# Patient Record
Sex: Male | Born: 1975 | Race: White | Hispanic: No | Marital: Married | Smoking: Current every day smoker
Health system: Southern US, Community
[De-identification: ages and names within clinical notes are randomized; demographics above are authoritative.]

## PROBLEM LIST (undated history)

## (undated) HISTORY — PX: HERNIA REPAIR: SHX51

---

## 2005-03-07 ENCOUNTER — Emergency Department (HOSPITAL_COMMUNITY): Admission: EM | Admit: 2005-03-07 | Discharge: 2005-03-07 | Payer: Self-pay | Admitting: Emergency Medicine

## 2005-03-10 ENCOUNTER — Emergency Department (HOSPITAL_COMMUNITY): Admission: EM | Admit: 2005-03-10 | Discharge: 2005-03-10 | Payer: Self-pay | Admitting: Emergency Medicine

## 2005-03-11 ENCOUNTER — Inpatient Hospital Stay (HOSPITAL_COMMUNITY): Admission: EM | Admit: 2005-03-11 | Discharge: 2005-03-14 | Payer: Self-pay | Admitting: Emergency Medicine

## 2006-07-26 ENCOUNTER — Emergency Department (HOSPITAL_COMMUNITY): Admission: EM | Admit: 2006-07-26 | Discharge: 2006-07-26 | Payer: Self-pay | Admitting: Emergency Medicine

## 2011-10-10 ENCOUNTER — Encounter: Payer: Self-pay | Admitting: *Deleted

## 2011-10-10 ENCOUNTER — Emergency Department (INDEPENDENT_AMBULATORY_CARE_PROVIDER_SITE_OTHER): Payer: BC Managed Care – PPO

## 2011-10-10 ENCOUNTER — Emergency Department (HOSPITAL_BASED_OUTPATIENT_CLINIC_OR_DEPARTMENT_OTHER)
Admission: EM | Admit: 2011-10-10 | Discharge: 2011-10-10 | Disposition: A | Discharge status: Home / Self Care | Payer: BC Managed Care – PPO | Attending: Emergency Medicine | Admitting: Emergency Medicine

## 2011-10-10 DIAGNOSIS — M545 Low back pain, unspecified: Secondary | ICD-10-CM

## 2011-10-10 DIAGNOSIS — Y92009 Unspecified place in unspecified non-institutional (private) residence as the place of occurrence of the external cause: Secondary | ICD-10-CM | POA: Insufficient documentation

## 2011-10-10 DIAGNOSIS — W108XXA Fall (on) (from) other stairs and steps, initial encounter: Secondary | ICD-10-CM | POA: Insufficient documentation

## 2011-10-10 DIAGNOSIS — S239XXA Sprain of unspecified parts of thorax, initial encounter: Secondary | ICD-10-CM | POA: Insufficient documentation

## 2011-10-10 DIAGNOSIS — IMO0002 Reserved for concepts with insufficient information to code with codable children: Secondary | ICD-10-CM

## 2011-10-10 DIAGNOSIS — W19XXXA Unspecified fall, initial encounter: Secondary | ICD-10-CM

## 2011-10-10 DIAGNOSIS — F17200 Nicotine dependence, unspecified, uncomplicated: Secondary | ICD-10-CM | POA: Insufficient documentation

## 2011-10-10 MED ORDER — OXYCODONE-ACETAMINOPHEN 5-325 MG PO TABS: 2.0000 | ORAL_TABLET | ORAL | Status: AC | PRN

## 2011-10-10 MED ORDER — ONDANSETRON HCL 4 MG/2ML IJ SOLN
INTRAMUSCULAR | Status: AC
  Filled 2011-10-10: qty 2

## 2011-10-10 MED ORDER — HYDROMORPHONE HCL PF 1 MG/ML IJ SOLN
1.0000 mg | Freq: Once | INTRAMUSCULAR | Status: AC
  Administered 2011-10-10: 1 mg via INTRAVENOUS
  Filled 2011-10-10: qty 1

## 2011-10-10 MED ORDER — KETOROLAC TROMETHAMINE 30 MG/ML IJ SOLN
30.0000 mg | Freq: Once | INTRAMUSCULAR | Status: AC
  Administered 2011-10-10: 30 mg via INTRAVENOUS
  Filled 2011-10-10: qty 1

## 2011-10-10 MED ORDER — ONDANSETRON HCL 4 MG/2ML IJ SOLN
4.0000 mg | Freq: Once | INTRAMUSCULAR | Status: AC
  Administered 2011-10-10: 4 mg via INTRAVENOUS

## 2011-10-10 MED ORDER — SODIUM CHLORIDE 0.9 % IV SOLN
Freq: Once | INTRAVENOUS | Status: AC
  Administered 2011-10-10: 16:00:00 via INTRAVENOUS

## 2011-10-10 MED ORDER — DIAZEPAM 5 MG PO TABS: 5.0000 mg | ORAL_TABLET | Freq: Two times a day (BID) | ORAL | Status: AC

## 2011-10-10 NOTE — ED Notes (Signed)
Care plan and follow up reviewed 

## 2011-10-10 NOTE — ED Notes (Signed)
Patient transported to X-ray 

## 2011-10-10 NOTE — ED Provider Notes (Signed)
History    This chart was scribed for Philip Baker, MD, MD by Smitty Pluck. The patient was seen in room MHT14 and the patient's care was started at 4:59PM.  CSN: [id number] Arrival date & time: 10/10/2011  3:14 PM   First MD Initiated Contact with Patient 10/10/11 1654      Chief Complaint  Patient presents with  . Fall    (Consider location/radiation/quality/duration/timing/severity/associated sxs/prior treatment) Patient is a 35 y.o. male presenting with fall. The history is provided by the patient.  Fall   Philip Bond is a 35 y.o. male who presents to the Emergency Department complaining of moderate pain in left groin and lower back area onset today after fall down stairs today while getting items out of the attic. Pt still is able to move. He denies head injury or LOC. Pt reports that he has had back problems before.    History reviewed. No pertinent past medical history.  Past Surgical History  Procedure Date  . Hernia repair     History reviewed. No pertinent family history.  History  Substance Use Topics  . Smoking status: Current Everyday Smoker  . Smokeless tobacco: Not on file  . Alcohol Use: No      Review of Systems  All other systems reviewed and are negative.  10 Systems reviewed and are negative for acute change except as noted in the HPI.   Allergies  Review of patient's allergies indicates no known allergies.  Home Medications   Current Outpatient Rx  Name Route Sig Dispense Refill  . HYDROCODONE-ACETAMINOPHEN 7.5-325 MG PO TABS Oral Take 1 tablet by mouth every 6 (six) hours as needed. For pain        BP 139/88  Pulse 58  Temp(Src) 97.7 F (36.5 C) (Oral)  Resp 18  Ht 6\' 2"  (1.88 m)  Wt 185 lb (83.915 kg)  BMI 23.75 kg/m2  SpO2 100%  Physical Exam  Nursing note and vitals reviewed. Musculoskeletal:       5/5 strength in extremities  spinal tenderness  Mid and Lower Lumbar spinal tenderness    +2 grip strength  Left  hip pain with internal/external rotation        ED Course  Procedures (including critical care time)  DIAGNOSTIC STUDIES: Oxygen Saturation is 100% on room air, normal by my interpretation.    COORDINATION OF CARE:    Labs Reviewed - No data to display Dg Thoracic Spine 4v  10/10/2011  *RADIOLOGY REPORT*  Clinical Data: Fall, low back pain  THORACIC SPINE - 4+ VIEW  Comparison: None.  Findings: Thoracic spine is normal in alignment and position.  No fracture or dislocation is seen.  Vertebral body heights and intervertebral disc spaces are maintained.  Visualized lungs are grossly clear.  IMPRESSION: Normal thoracic spine radiographs.  Original Report Authenticated By: Charline Bills, M.D.   Dg Lumbar Spine Complete  10/10/2011  *RADIOLOGY REPORT*  Clinical Data: Fall, low back pain  LUMBAR SPINE - COMPLETE 4+ VIEW  Comparison: None.  Findings: Lumbar spine is normal alignment and position.  No evidence of fracture or dislocation.  Vertebral body heights are maintained.  Mild degenerative changes at L4-5.  Prior left ventral hernia repair.  IMPRESSION: No fracture or dislocation is seen.  Original Report Authenticated By: Charline Bills, M.D.     No diagnosis found.    MDM  Pt given pain meds and feels better--repeat neuro exam remains stable      I personally performed the  services described in this documentation, which was scribed in my presence. The recorded information has been reviewed and considered.    Philip Baker, MD 10/10/11 [phone]

## 2011-10-10 NOTE — ED Notes (Signed)
Pt states he fell out of the attic earlier today. Now c/o pain to lower back and pelvis. Landed on buttocks. Tingling to feet. Feel weak.

## 2011-10-10 NOTE — ED Notes (Signed)
Pt getting items out of attic.  Pt feel approx 5 feet onto buttocks. Pt c/o pain in lower sacral area.  Pt denies loss of bowel or bladder.  Pt denies head injury or loc.  Pt states his legs feel weak and are trembling from pain.

## 2012-06-27 IMAGING — CR DG LUMBAR SPINE COMPLETE 4+V
5 series · 5 of 5 positions shown · non-contrast
Comparison: None.

CLINICAL DATA: Fall, low back pain

LUMBAR SPINE - COMPLETE 4+ VIEW

[t l-spine a.p.]
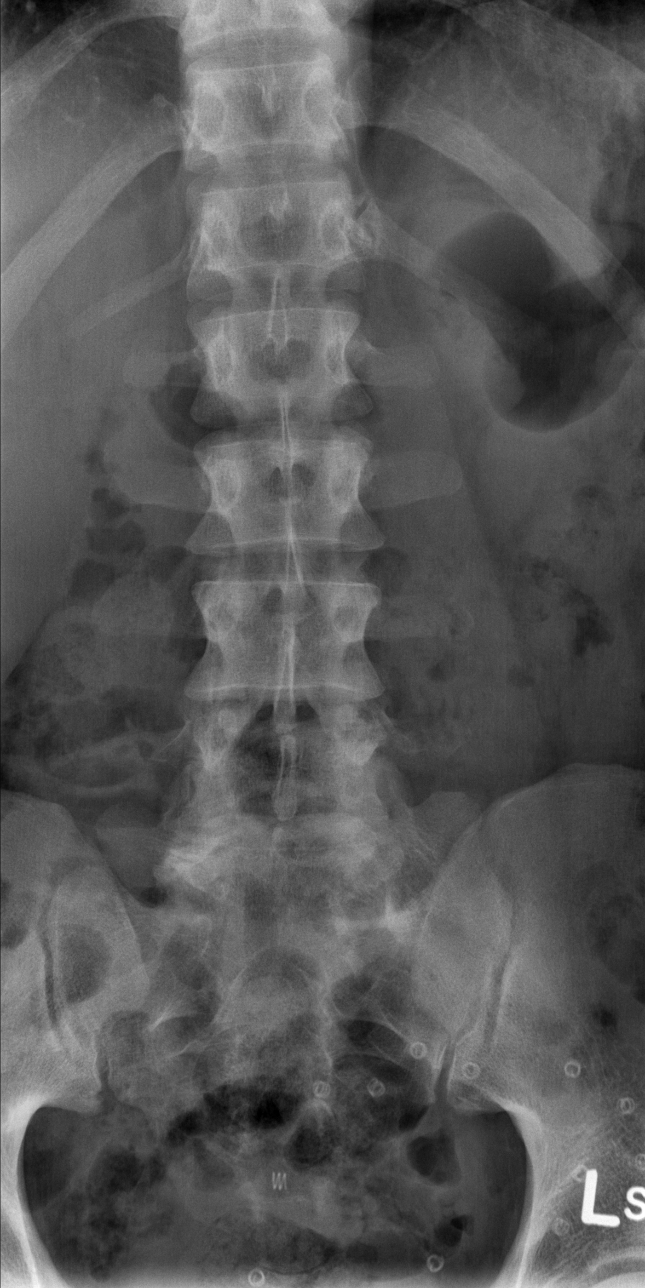

[t l-spine oblique exposure (1 of 2)]
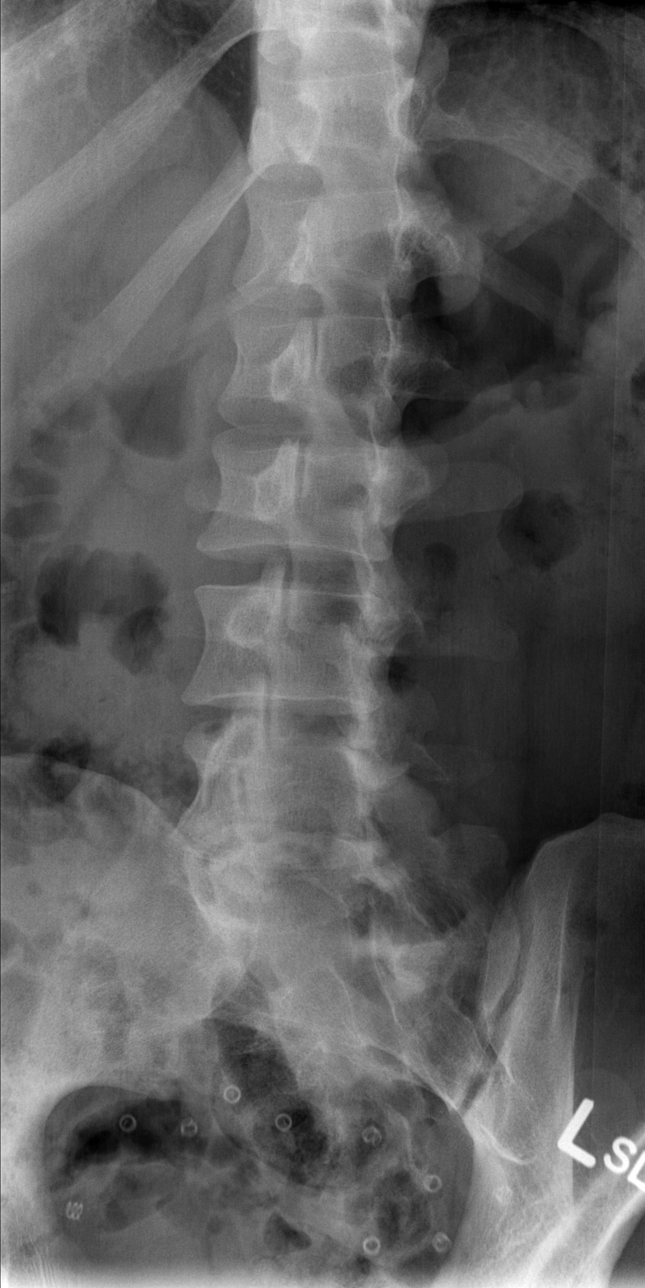

[t l-spine oblique exposure (2 of 2)]
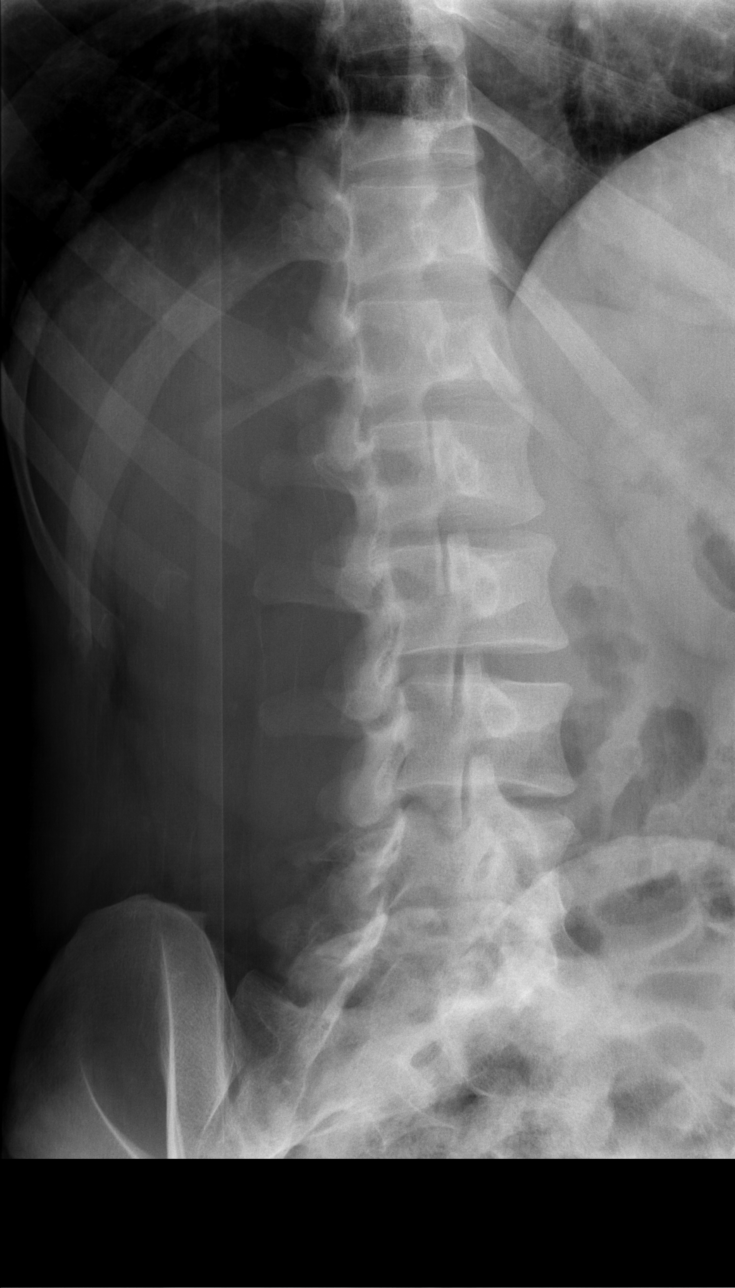

[t l-spine lat]
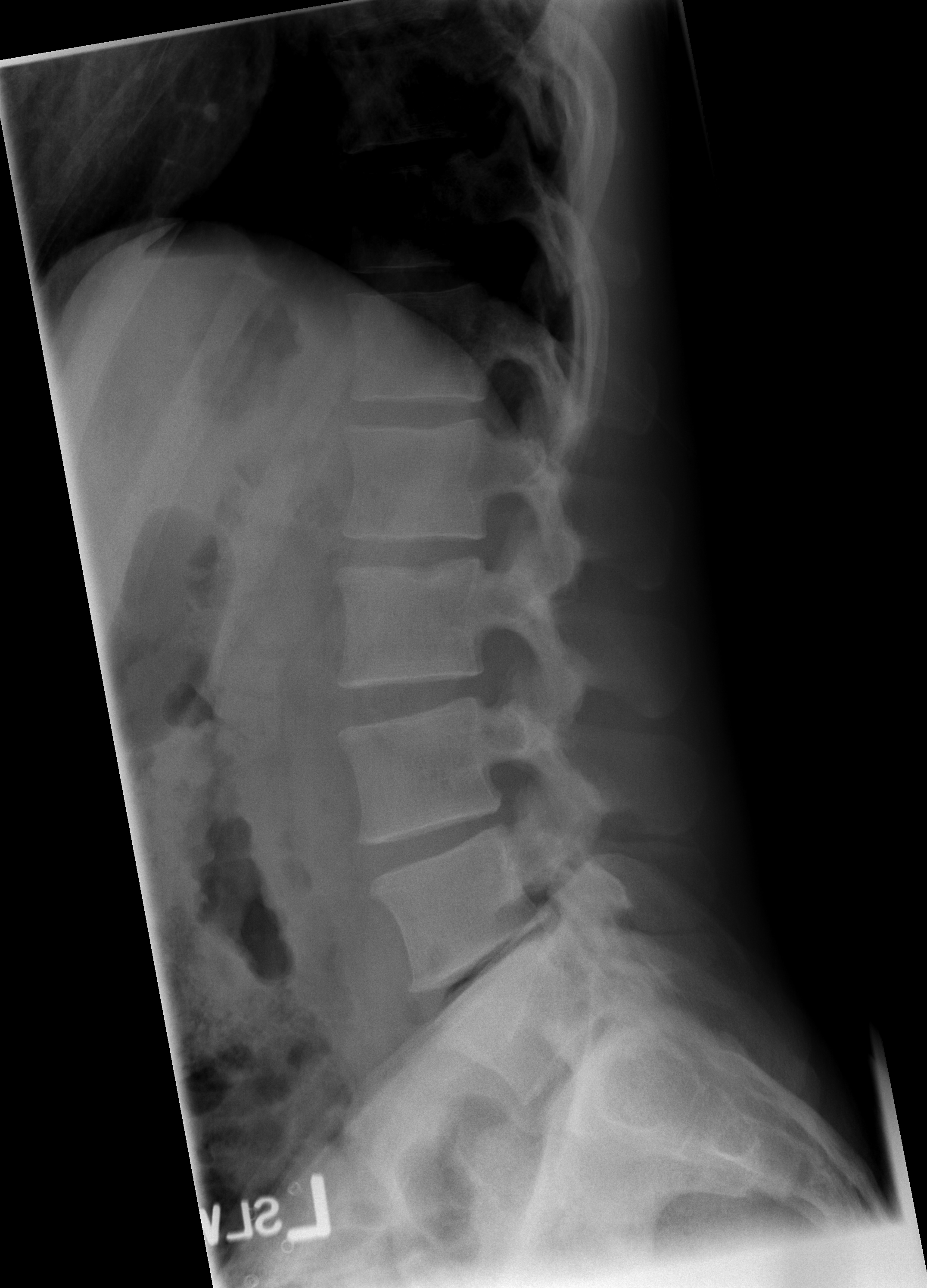

[t l-spine l5-s1 spot]
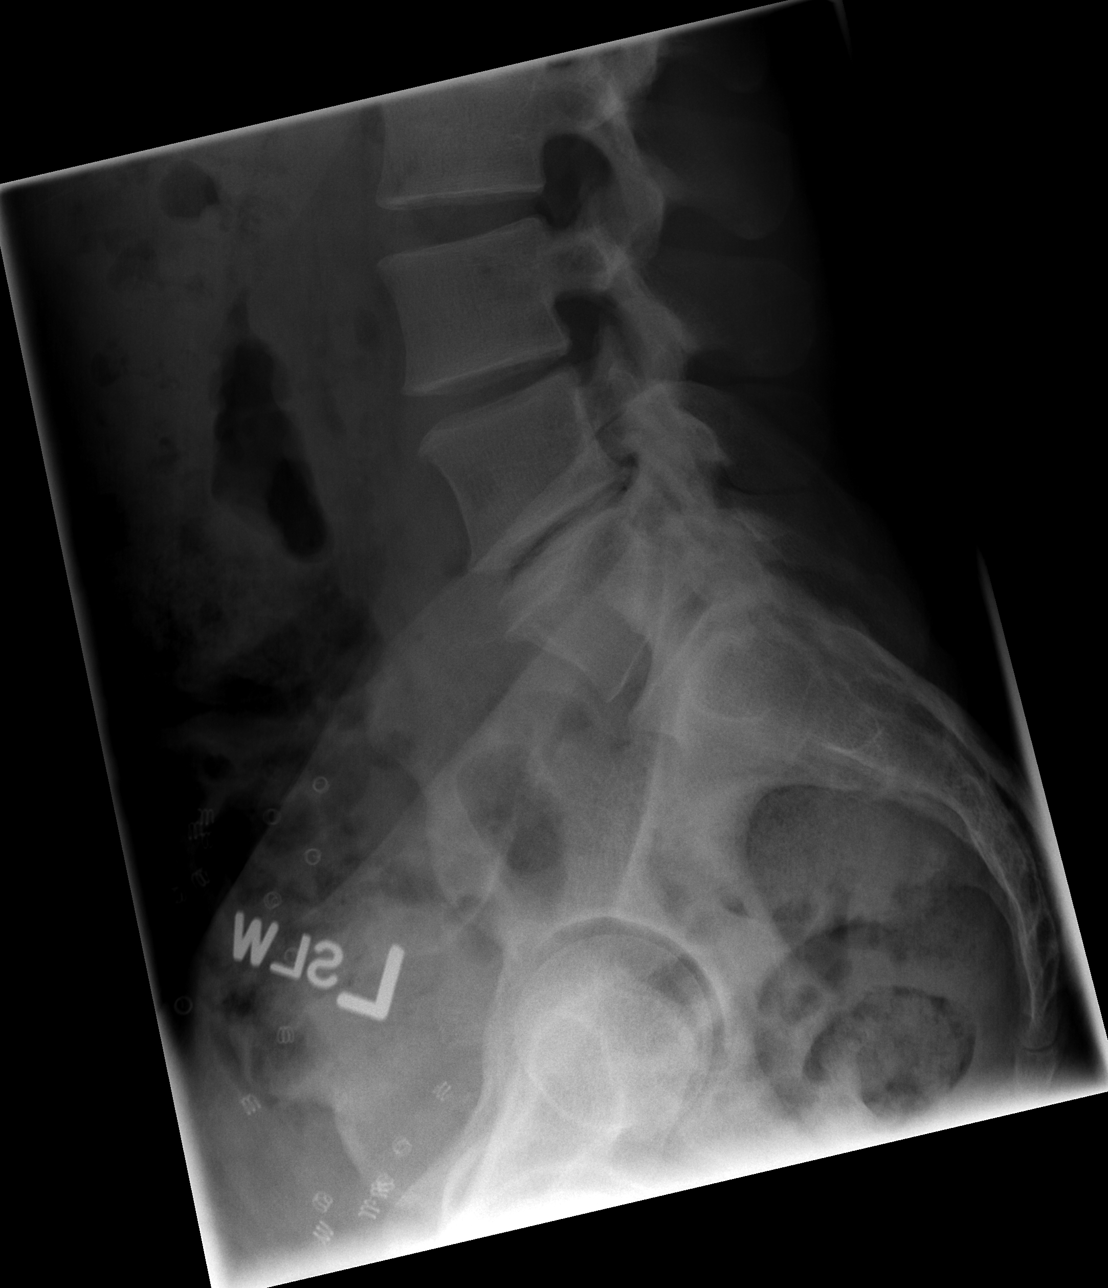

[5 of 5 positions shown; findings below may reference images not displayed]

FINDINGS: Lumbar spine is normal alignment and position.

No evidence of fracture or dislocation.  Vertebral body heights are
maintained.

Mild degenerative changes at L4-5.

Prior left ventral hernia repair.
IMPRESSION: No fracture or dislocation is seen.

## 2020-03-07 ENCOUNTER — Encounter: Payer: Self-pay | Admitting: Sports Medicine

## 2020-03-07 ENCOUNTER — Other Ambulatory Visit: Payer: Self-pay | Admitting: Sports Medicine

## 2020-03-07 ENCOUNTER — Ambulatory Visit: Payer: BC Managed Care – PPO | Admitting: Sports Medicine

## 2020-03-07 ENCOUNTER — Ambulatory Visit (INDEPENDENT_AMBULATORY_CARE_PROVIDER_SITE_OTHER): Payer: BC Managed Care – PPO

## 2020-03-07 ENCOUNTER — Other Ambulatory Visit: Payer: Self-pay

## 2020-03-07 DIAGNOSIS — B351 Tinea unguium: Secondary | ICD-10-CM | POA: Diagnosis not present

## 2020-03-07 DIAGNOSIS — M76821 Posterior tibial tendinitis, right leg: Secondary | ICD-10-CM | POA: Diagnosis not present

## 2020-03-07 DIAGNOSIS — M2141 Flat foot [pes planus] (acquired), right foot: Secondary | ICD-10-CM

## 2020-03-07 DIAGNOSIS — M2142 Flat foot [pes planus] (acquired), left foot: Secondary | ICD-10-CM

## 2020-03-07 DIAGNOSIS — M79671 Pain in right foot: Secondary | ICD-10-CM

## 2020-03-07 DIAGNOSIS — Q742 Other congenital malformations of lower limb(s), including pelvic girdle: Secondary | ICD-10-CM

## 2020-03-07 DIAGNOSIS — M898X7 Other specified disorders of bone, ankle and foot: Secondary | ICD-10-CM | POA: Diagnosis not present

## 2020-03-07 NOTE — Progress Notes (Signed)
Subjective: Philip Bond is a 44 y.o. male patient seen today in office with complaint of 1.  Mildly painful thickened and discolored nails. Patient is desiring treatment for nail changes; has tried trimming them in the past with no improvement. Reports that nails are becoming difficult to manage because of the thickness and reports that they get really sore. 2.  Occasional pain at the medial foot and ankle reports that he had an injury in 2010 to this foot and ankle and ever since he is always had occasional pain with the bone at the side of the ankle joint sticking out a little bit more patient denies any rubbing or pain when in shoes but there are certain shoes that he cannot wear due to the pronounced bone.  Patient denies any redness warmth swelling or any ulceration over the area of the right foot at the prominent bone.  Patient also admits to a history of severe ankle sprains.  Patient has no other pedal complaints at this time.   Review of Systems  All other systems reviewed and are negative.   There are no problems to display for this patient.   Current Outpatient Medications on File Prior to Visit  Medication Sig Dispense Refill  . diclofenac (VOLTAREN) 75 MG EC tablet Take 75 mg by mouth 2 (two) times daily.     No current facility-administered medications on file prior to visit.    No Known Allergies  Objective: Physical Exam  General: Well developed, nourished, no acute distress, awake, alert and oriented x 3  Vascular: Dorsalis pedis artery 2/4 bilateral, Posterior tibial artery 2/4 bilateral, skin temperature warm to warm proximal to distal bilateral lower extremities, no varicosities, pedal hair present bilateral.  Neurological: Gross sensation present via light touch bilateral.   Dermatological: Skin is warm, dry, and supple bilateral, right third and fourth toenails are tender, elongated, thick, and discolored with mild subungal debris, no webspace macerations present  bilateral, no open lesions present bilateral, no callus/corns/hyperkeratotic tissue present bilateral. No signs of infection bilateral.  Musculoskeletal: Prominent navicular noted along the right foot, pes planus foot type, there is no pain to palpation along the posterior tibial tendon on the right, pes planus foot type, muscular strength within normal limits without painon range of motion. No pain with calf compression bilateral.  Assessment and Plan:  Problem List Items Addressed This Visit    None    Visit Diagnoses    Nail fungus    -  Primary   Relevant Orders   Culture, fungus without smear   Posterior tibial tendon dysfunction (PTTD) of right lower extremity       Accessory navicular bone of right foot       Exostosis of foot       Pes planus of both feet       Foot pain, right          -Examined patient -Discussed treatment options for painful dystrophic nails on the right -Fungal culture was obtained by removing a portion of the hard nail itself from each of the involved toenails using a sterile nail nipper and sent to [facility] lab. Patient tolerated the biopsy procedure well without discomfort or need for anesthesia.  -Discussed with patient treatment options for pes planus and enlarged navicular bone on the right -Advised patient since currently asymptomatic and only has occasional pain would recommend at this time good supportive shoes, insoles, and supportive bracing as needed especially when at work -Advised patient if right  foot and ankle pain becomes more persistent we can further work this up to further evaluate the extent of his navicular and even the posterior tibial tendon however at this time since symptoms are not acute and not constant we will closely monitor -Patient to return in 4 weeks for follow up evaluation and discussion of fungal culture results or sooner if symptoms worsen.  Advised patient at this visit we may consider proceeding with permanent nail avulsion  procedures of the third and fourth toenails pending the results of his fungal culture.  Patient was okay with this idea and reports that he is okay with having to lose his toenail if they cannot be salvaged due to the extent of the discoloration and thickness of them.  Asencion Islam, DPM

## 2020-04-04 ENCOUNTER — Ambulatory Visit: Payer: BC Managed Care – PPO | Admitting: Sports Medicine

## 2020-04-11 ENCOUNTER — Other Ambulatory Visit: Payer: Self-pay

## 2020-04-11 ENCOUNTER — Encounter: Payer: Self-pay | Admitting: Sports Medicine

## 2020-04-11 ENCOUNTER — Ambulatory Visit: Payer: BC Managed Care – PPO | Admitting: Sports Medicine

## 2020-04-11 DIAGNOSIS — M79674 Pain in right toe(s): Secondary | ICD-10-CM | POA: Diagnosis not present

## 2020-04-11 DIAGNOSIS — B351 Tinea unguium: Secondary | ICD-10-CM | POA: Diagnosis not present

## 2020-04-11 MED ORDER — NEOMYCIN-POLYMYXIN-HC 3.5-10000-1 OT SOLN: OTIC | 0 refills | Status: AC

## 2020-04-11 NOTE — Patient Instructions (Signed)

## 2020-04-11 NOTE — Progress Notes (Signed)
Subjective: Philip Bond is a 44 y.o. male patient presents to office today for review of fungal culture results and for possible nail procedure on the right. Patient reports that nothing as changed since last visit, no other pedal complaints.  There are no problems to display for this patient.   Current Outpatient Medications on File Prior to Visit  Medication Sig Dispense Refill  . diclofenac (VOLTAREN) 75 MG EC tablet Take 75 mg by mouth 2 (two) times daily.     No current facility-administered medications on file prior to visit.    No Known Allergies  Objective:  There were no vitals filed for this visit.  General: Well developed, nourished, in no acute distress, alert and oriented x3   Dermatology: Skin is warm, dry and supple bilateral. Right 3-4 toenail appears to be severely thickened and deformed. (+) faint focal proximal nail fold erythema. (-) Edema. (-) serosanguous drainage present. The remaining nails appear unremarkable at this time. There are no open sores, lesions or other signs of infection present.  Vascular: Dorsalis Pedis artery and Posterior Tibial artery pedal pulses are 2/4 bilateral with immedate capillary fill time. Pedal hair growth present. No lower extremity edema.   Neruologic: Grossly intact via light touch bilateral.  Musculoskeletal: Tenderness to palpation of the right 3,4 toenails  Fungal culture results: + Fungus and microtrauma   Assesement and Plan: Problem List Items Addressed This Visit    None    Visit Diagnoses    Nail fungus    -  Primary   Toe pain, right         -Discussed treatment alternatives and plan of care; Explained permanent/temporary nail avulsion and post procedure course to patient. Patient elects for temp nail avulsion on right 3,4 toenails - After a verbal and written consent, injected 3 ml of a 50:50 mixture of 2% plain lidocaine and 0.5% plain marcaine in a normal digital block fashion. Next, a betadine prep was  performed. Anesthesia was tested and found to be appropriate.  The offending right 3 and 4th toenail completely was then incised from the hyponychium to the epinychium. The offending nail border was removed and cleared from the field. The area was curretted for any remaining nail or spicules the area was then flushed with alcohol and dressed with antibiotic cream and a dry sterile dressing. -Patient was instructed to leave the dressing intact for today and begin soaking in a weak solution of betadine or Epsom salt and water tomorrow. Patient was instructed to soak for 15-20 minutes each day and apply neosporin/corticosporin and a gauze or bandaid dressing each day. -Patient was instructed to monitor the toe for signs of infection and return to office if toe becomes red, hot or swollen. -Advised ice, elevation, and tylenol or motrin if needed for pain.  -Patient is to return in 2 weeks for follow up care/nail check or sooner if problems arise. Advised patient that when he returns to office we sill start PO Lamisil.  Asencion Islam, DPM

## 2020-04-25 ENCOUNTER — Ambulatory Visit: Payer: BC Managed Care – PPO | Admitting: Sports Medicine

## 2020-04-25 ENCOUNTER — Encounter: Payer: Self-pay | Admitting: Sports Medicine

## 2020-04-25 ENCOUNTER — Other Ambulatory Visit: Payer: Self-pay

## 2020-04-25 DIAGNOSIS — B351 Tinea unguium: Secondary | ICD-10-CM

## 2020-04-25 DIAGNOSIS — M79674 Pain in right toe(s): Secondary | ICD-10-CM

## 2020-04-25 DIAGNOSIS — Z9889 Other specified postprocedural states: Secondary | ICD-10-CM

## 2020-04-25 MED ORDER — TERBINAFINE HCL 250 MG PO TABS: 250.0000 mg | ORAL_TABLET | Freq: Every day | ORAL | 0 refills | Status: AC

## 2020-04-25 NOTE — Progress Notes (Signed)
Subjective: Philip Bond is a 44 y.o. male patient returns to office today for follow up evaluation after having Right 3-4 toes temporary nail avulsion performed on (04-11-20). Patient has been soaking using epsom salt and applying topical antibiotic covered with bandaid daily but stopped a few days ago.  Patient reports that it was uncomfortable at first but now it feels normal with walking with slight redness no swelling no drainage or any other symptoms.  Reports that he still covering toes with daily Neosporin when he is at work. Patient deniesfever/chills/nausea/vomitting/any other related constitutional symptoms at this time.  There are no problems to display for this patient.   Current Outpatient Medications on File Prior to Visit  Medication Sig Dispense Refill  . diclofenac (VOLTAREN) 75 MG EC tablet Take 75 mg by mouth 2 (two) times daily.    Marland Kitchen neomycin-polymyxin-hydrocortisone (CORTISPORIN) OTIC solution Apply 2-3 drops to the ingrown toenail site twice daily. Cover with band-aid. 10 mL 0   No current facility-administered medications on file prior to visit.    No Known Allergies  Objective:  General: Well developed, nourished, in no acute distress, alert and oriented x3   Dermatology: Skin is warm, dry and supple bilateral.  Right third and fourth toes nail bed appears to be clean, dry, with mild granular tissue and surrounding eschar/scab. (-) Erythema. (-) Edema. (-) serosanguous drainage present. The remaining nails appear unremarkable at this time. There are no other lesions or other signs of infection  present.  Neurovascular status: Intact. No lower extremity swelling; No pain with calf compression bilateral.  Musculoskeletal: Decreased tenderness to palpation of the right third and fourth toes muscular strength within normal limits bilateral.   Assesement and Plan: Problem List Items Addressed This Visit    None    Visit Diagnoses    S/P nail surgery    -  Primary    Nail fungus       Toe pain, right          -Examined patient  -Right third and fourth toes well-healed with minimal dry scabbing -Patient may discontinue soaking -May leave open to air -Educated patient on long term care after nail surgery. -Patient was instructed to monitor the toe for reoccurrence and signs of infection; Patient advised to return to office or go to ER if toe becomes red, hot or swollen. -Ordered liver function panel for patient to start on Lamisil -Patient is to return in 6 weeks for medication check or sooner if problems arise.  Asencion Islam, DPM

## 2020-05-14 ENCOUNTER — Other Ambulatory Visit: Payer: Self-pay | Admitting: Sports Medicine

## 2020-05-14 DIAGNOSIS — M76821 Posterior tibial tendinitis, right leg: Secondary | ICD-10-CM

## 2020-06-06 ENCOUNTER — Encounter: Payer: Self-pay | Admitting: Sports Medicine

## 2020-06-06 ENCOUNTER — Other Ambulatory Visit: Payer: Self-pay

## 2020-06-06 ENCOUNTER — Ambulatory Visit (INDEPENDENT_AMBULATORY_CARE_PROVIDER_SITE_OTHER): Payer: BC Managed Care – PPO | Admitting: Sports Medicine

## 2020-06-06 DIAGNOSIS — M79674 Pain in right toe(s): Secondary | ICD-10-CM

## 2020-06-06 DIAGNOSIS — B351 Tinea unguium: Secondary | ICD-10-CM

## 2020-06-06 DIAGNOSIS — Z79899 Other long term (current) drug therapy: Secondary | ICD-10-CM

## 2020-06-06 DIAGNOSIS — Z9889 Other specified postprocedural states: Secondary | ICD-10-CM

## 2020-06-06 NOTE — Progress Notes (Signed)
Subjective: Philip Bond is a 44 y.o. male patient returns to office today for follow up evaluation after having Right 3-4 toes temporary nail avulsion performed on (04-11-20) and for medication check currently on Lamisil. Patient reports that he is doing good with no cough symptoms besides on time a bad taste from the medication reports that he has been consistent with taking the medication as instructed and has only missed about 3 days.  Denies nausea vomiting fever chills or any other constitutional symptoms at this time.  There are no problems to display for this patient.   Current Outpatient Medications on File Prior to Visit  Medication Sig Dispense Refill  . diclofenac (VOLTAREN) 75 MG EC tablet Take 75 mg by mouth 2 (two) times daily.    Marland Kitchen neomycin-polymyxin-hydrocortisone (CORTISPORIN) OTIC solution Apply 2-3 drops to the ingrown toenail site twice daily. Cover with band-aid. 10 mL 0  . terbinafine (LAMISIL) 250 MG tablet Take 1 tablet (250 mg total) by mouth daily. 90 tablet 0   No current facility-administered medications on file prior to visit.    No Known Allergies  Objective:  General: Well developed, nourished, in no acute distress, alert and oriented x3   Dermatology: Skin is warm, dry and supple bilateral.  Right third and fourth toes nail bed appears to be clean, with early nail ingrowth and dry scabbing. (-) Erythema. (-) Edema. (-) serosanguous drainage present. The remaining nails appear unremarkable at this time.  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  There are no other lesions or other signs of infection present.  Neurovascular status: Intact. No lower extremity swelling; No pain with calf compression bilateral.  Musculoskeletal: Decreased tenderness to palpation of the right third and fourth toes muscular strength within normal limits bilateral.   Assesement and Plan: Problem List Items Addressed This Visit     None    Visit Diagnoses    S/P nail surgery    -  Primary   Nail fungus       Toe pain, right       Encounter for long-term current use of high risk medication          -Examined patient  -Right third and fourth toes well-healed  -Continue with Lamisil until completed we will not do any additional blood work and since patient is tapering off medication -Patient is to return as needed if fails to continue to improve or sooner if problems arise.  Asencion Islam, DPM

## 2021-07-17 ENCOUNTER — Encounter: Payer: Self-pay | Admitting: Sports Medicine

## 2021-07-17 ENCOUNTER — Other Ambulatory Visit: Payer: Self-pay

## 2021-07-17 ENCOUNTER — Ambulatory Visit (INDEPENDENT_AMBULATORY_CARE_PROVIDER_SITE_OTHER): Payer: BC Managed Care – PPO | Admitting: Sports Medicine

## 2021-07-17 DIAGNOSIS — B351 Tinea unguium: Secondary | ICD-10-CM

## 2021-07-17 DIAGNOSIS — M79674 Pain in right toe(s): Secondary | ICD-10-CM

## 2021-07-17 DIAGNOSIS — L608 Other nail disorders: Secondary | ICD-10-CM | POA: Diagnosis not present

## 2021-07-17 DIAGNOSIS — M7989 Other specified soft tissue disorders: Secondary | ICD-10-CM | POA: Diagnosis not present

## 2021-07-17 NOTE — Patient Instructions (Signed)

## 2021-07-17 NOTE — Progress Notes (Signed)
Subjective: Philip Bond is a 45 y.o. male patient presents to office today for f/u of nail fungus at right 3-4 toes states that over time the nails have grown back about the same and getting some pain at the thick nail.  Patient wants to discuss other options for his big toenails now that they have reoccurred and are painful.  There are no problems to display for this patient.   Current Outpatient Medications on File Prior to Visit  Medication Sig Dispense Refill   diclofenac (VOLTAREN) 75 MG EC tablet Take 75 mg by mouth 2 (two) times daily.     neomycin-polymyxin-hydrocortisone (CORTISPORIN) OTIC solution Apply 2-3 drops to the ingrown toenail site twice daily. Cover with band-aid. 10 mL 0   terbinafine (LAMISIL) 250 MG tablet Take 1 tablet (250 mg total) by mouth daily. 90 tablet 0   No current facility-administered medications on file prior to visit.    No Known Allergies  Objective:  There were no vitals filed for this visit.  General: Well developed, nourished, in no acute distress, alert and oriented x3   Dermatology: Skin is warm, dry and supple bilateral. Right 3-4 toenail appears to be severely thickened and deformed at the distal aspects. (+) faint focal proximal nail fold erythema. (-) Edema. (-) serosanguous drainage present. The remaining nails appear unremarkable at this time. There are no open sores, lesions or other signs of infection present. 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  Vascular: Dorsalis Pedis artery and Posterior Tibial artery pedal pulses are 2/4 bilateral with immedate capillary fill time. Pedal hair growth present. No lower extremity edema.   Neruologic: Grossly intact via light touch bilateral.  Musculoskeletal: Tenderness to palpation of the right 3,4 toenails with toe deformity as  above.   Assesement and Plan: Problem List Items Addressed This Visit   None Visit Diagnoses     Nail fungus    -  Primary   Toe pain, right       Nail deformity       Soft tissue mass            -Discussed treatment alternatives and plan of care; Explained permanent/temporary nail avulsion and post procedure course to patient. Patient elects for permanent nail avulsion on right 3,4 toenails - After a verbal and written consent, injected 3 ml of a 50:50 mixture of 2% plain lidocaine and 0.5% plain marcaine in a normal digital block fashion. Next, a betadine prep was performed. Anesthesia was tested and found to be appropriate. The offending right 3 and 4th toenail completely was then incised from the hyponychium to the epinychium. The offending nail border was removed and cleared from the field. The area was curretted for any remaining nail or spicules then phenol was applied and the area was then flushed with alcohol and dressed with antibiotic cream and a dry sterile dressing. -Patient was instructed to leave the dressing intact for today and begin soaking in a weak solution of betadine or Epsom salt and water tomorrow. Patient was instructed to soak for 15-20 minutes each day and apply neosporin/corticosporin and a gauze or bandaid dressing each day. -Patient was instructed to monitor the toe for signs of infection and return to office if toe becomes red, hot or swollen. -Advised ice, elevation, and tylenol or motrin if needed for pain.  -Patient is  to return in 2-3 weeks for follow up care/nail check or sooner if problems arise.  Advised patient for the soft tissue mass at a later time may benefit from a surgical biopsy/excision of the mass however at this time patient wants to wait on something like that because he cannot take any more time off of work for recovery.  Asencion Islam, DPM

## 2021-08-07 ENCOUNTER — Ambulatory Visit (INDEPENDENT_AMBULATORY_CARE_PROVIDER_SITE_OTHER): Payer: BC Managed Care – PPO | Admitting: Sports Medicine

## 2021-08-07 ENCOUNTER — Encounter: Payer: Self-pay | Admitting: Sports Medicine

## 2021-08-07 ENCOUNTER — Other Ambulatory Visit: Payer: Self-pay

## 2021-08-07 DIAGNOSIS — M7989 Other specified soft tissue disorders: Secondary | ICD-10-CM

## 2021-08-07 DIAGNOSIS — L608 Other nail disorders: Secondary | ICD-10-CM

## 2021-08-07 DIAGNOSIS — M79674 Pain in right toe(s): Secondary | ICD-10-CM

## 2021-08-07 DIAGNOSIS — Z9889 Other specified postprocedural states: Secondary | ICD-10-CM

## 2021-08-07 NOTE — Progress Notes (Signed)
Subjective: Philip Bond is a 45 y.o. male patient returns to office today for follow up evaluation after having Right 3-4 toes permanent nail avulsion performed on (07/17/2021).  Patient reports that there is some soreness after being in shoes all day but otherwise doing okay.  Patient reports he has been using Neosporin and Band-Aid and soaking with Epson salt as previously instructed.  Reports that he did dropped a box on his toes so they are still recovering from that as well.  No other pedal complaints noted.  There are no problems to display for this patient.   Current Outpatient Medications on File Prior to Visit  Medication Sig Dispense Refill   diclofenac (VOLTAREN) 75 MG EC tablet Take 75 mg by mouth 2 (two) times daily.     neomycin-polymyxin-hydrocortisone (CORTISPORIN) OTIC solution Apply 2-3 drops to the ingrown toenail site twice daily. Cover with band-aid. 10 mL 0   terbinafine (LAMISIL) 250 MG tablet Take 1 tablet (250 mg total) by mouth daily. 90 tablet 0   No current facility-administered medications on file prior to visit.    No Known Allergies  Objective:  General: Well developed, nourished, in no acute distress, alert and oriented x3   Dermatology: Skin is warm, dry and supple bilateral.  Right third and fourth toes nail bed appears to be clean, with scabbing (-) Erythema. (-) Edema. (-) serosanguous drainage present. The remaining nails appear unremarkable at this time.  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  There are no other lesions or other signs of infection present.  Neurovascular status: Intact. No lower extremity swelling; No pain with calf compression bilateral.  Musculoskeletal: Decreased tenderness to palpation of the right third and fourth toes muscular strength within normal limits bilateral.   Assesement and Plan: Problem List Items Addressed This Visit   None Visit Diagnoses     S/P nail  surgery    -  Primary   Nail deformity       Toe pain, right       Soft tissue mass           -Examined patient  -Right third and fourth toes are scabbing and healing well -May discontinue soaking -Continue for 1 more week protective Band-Aid with antibiotic cream -Advised patient to be careful not to bump for stubbed toes or to drop anything on his toes -Advised with supportive shoes daily that prevent rubbing or irritation at toes -Patient is to return as needed if fails to continue to improve or sooner if problems arise.  Asencion Islam, DPM
